# Patient Record
Sex: Male | Born: 1960
Health system: Southern US, Community
[De-identification: ages and names within clinical notes are randomized; demographics above are authoritative.]

---

## 1998-11-21 ENCOUNTER — Ambulatory Visit (HOSPITAL_COMMUNITY): Admission: RE | Admit: 1998-11-21 | Discharge: 1998-11-21 | Payer: Self-pay | Admitting: Orthopedic Surgery

## 1998-11-21 ENCOUNTER — Encounter: Payer: Self-pay | Admitting: Orthopedic Surgery

## 2001-05-12 ENCOUNTER — Emergency Department (HOSPITAL_COMMUNITY): Admission: EM | Admit: 2001-05-12 | Discharge: 2001-05-12 | Payer: Self-pay | Admitting: Emergency Medicine

## 2008-05-24 ENCOUNTER — Emergency Department (HOSPITAL_COMMUNITY): Admission: EM | Admit: 2008-05-24 | Discharge: 2008-05-24 | Payer: Self-pay | Admitting: Emergency Medicine

## 2009-03-12 IMAGING — CT CT ABDOMEN W/O CM
1 of 2 series · 15 of 32 positions shown, 19 images · non-contrast
Comparison: No priors

CT ABDOMEN

CLINICAL DATA: Left flank pain

CT ABDOMEN AND PELVIS WITHOUT CONTRAST
TECHNIQUE: Multidetector CT imaging of the abdomen and pelvis was
performed following the standard
protocol without intravenous contrast.

[Series 2: renal stone 5.0 b31f st · axial · 0.70mm/px · z∈[-403,-13]mm · 15 of 86 slices shown, 19 images]
[im 4/86  soft-tissue]
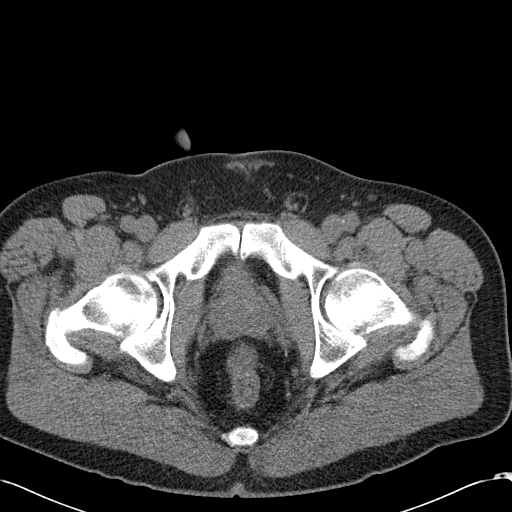
[im 4/86  bone]
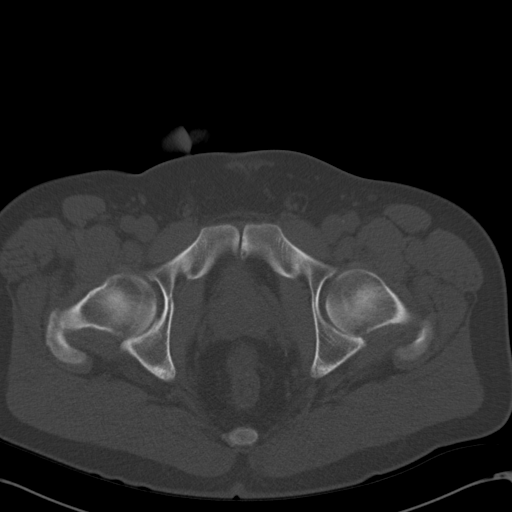
[im 10/86  soft-tissue]
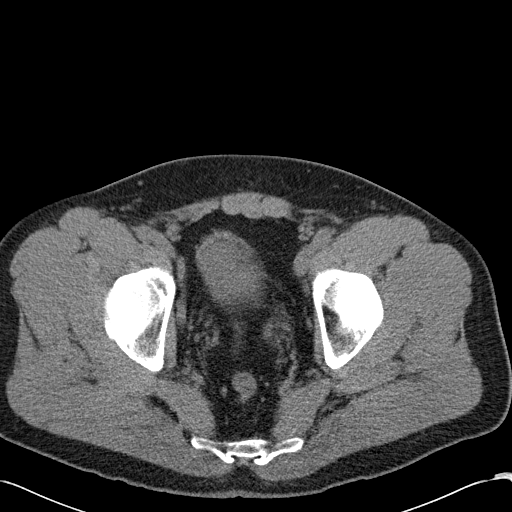
[im 17/86  soft-tissue]
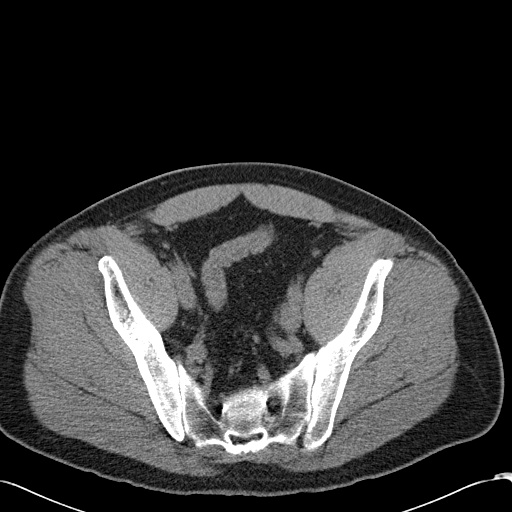
[im 23/86  soft-tissue]
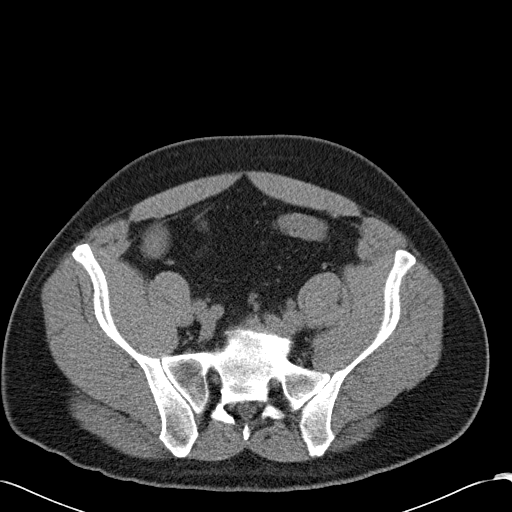
[im 30/86  soft-tissue]
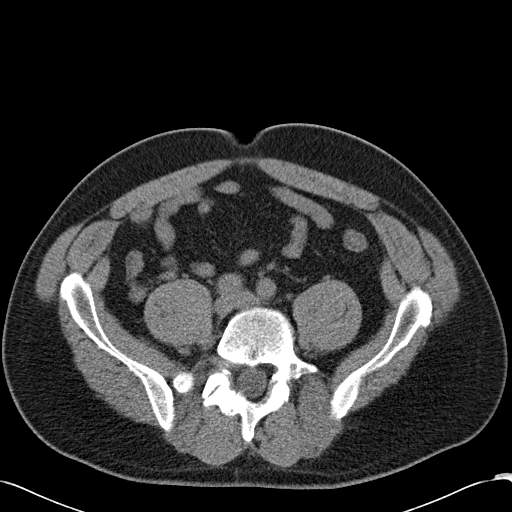
[im 36/86  soft-tissue]
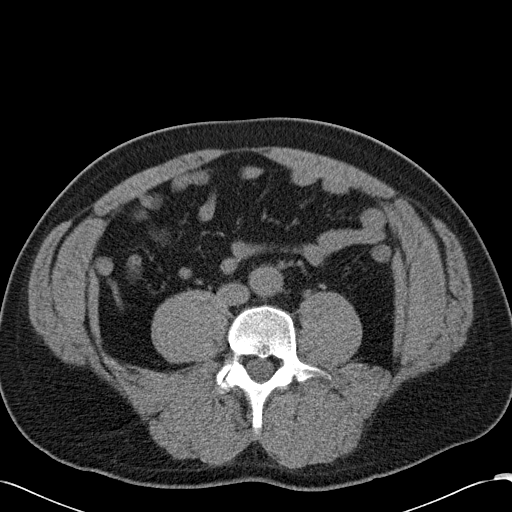
[im 43/86  soft-tissue]
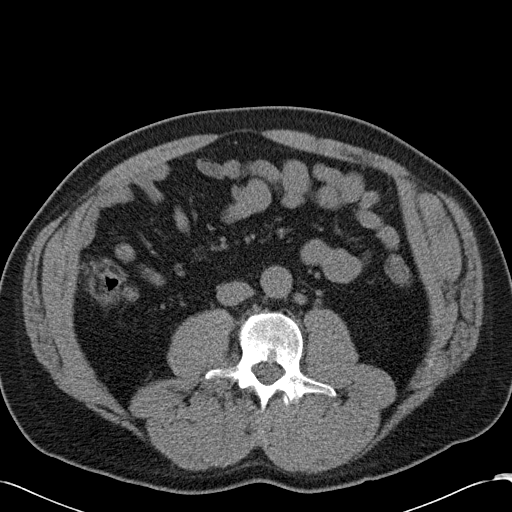
[im 50/86  soft-tissue]
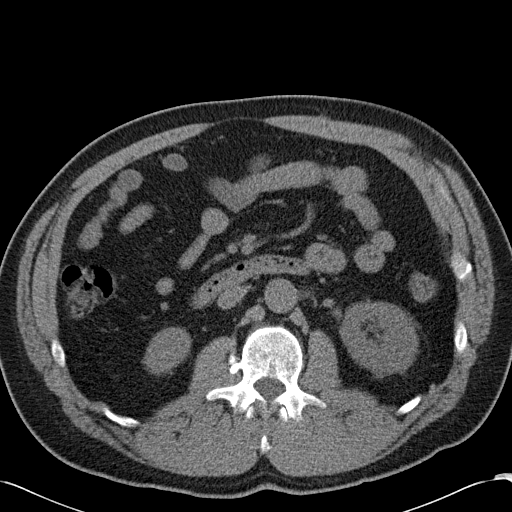
[im 56/86  soft-tissue]
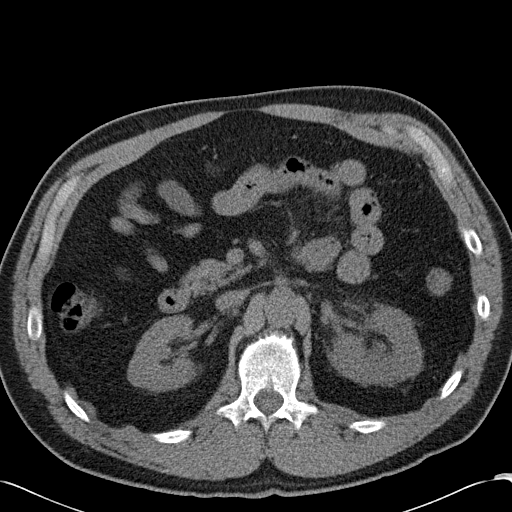
[im 56/86  bone]
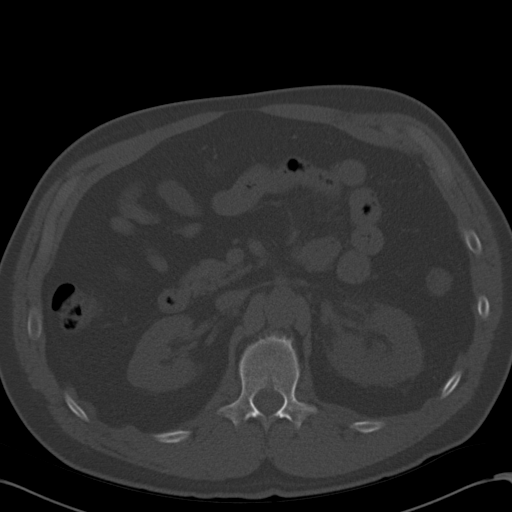
[im 63/86  soft-tissue]
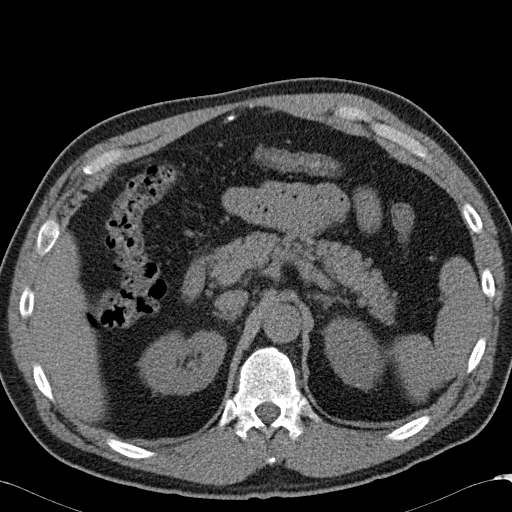
[im 69/86  soft-tissue]
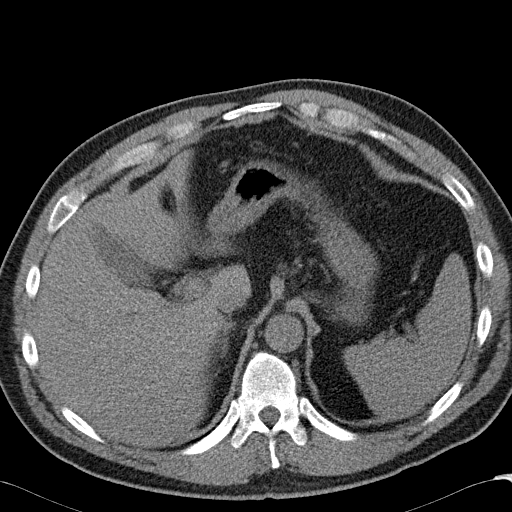
[im 72/86  lung]
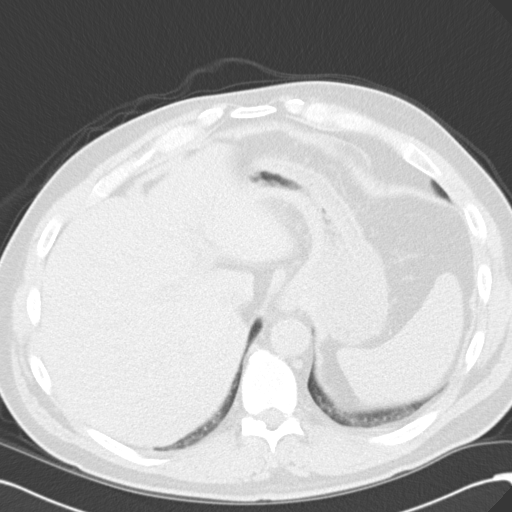
[im 76/86  soft-tissue]
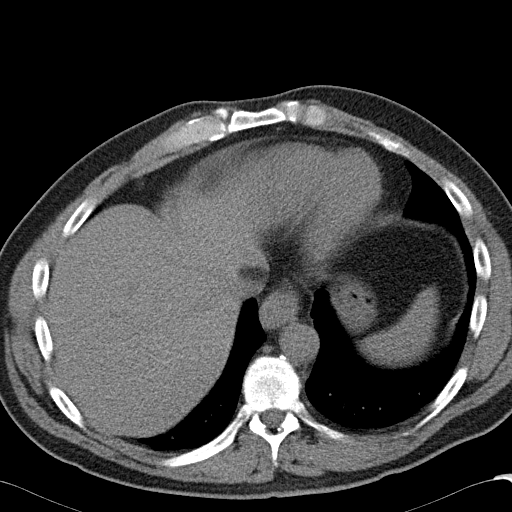
[im 76/86  lung]
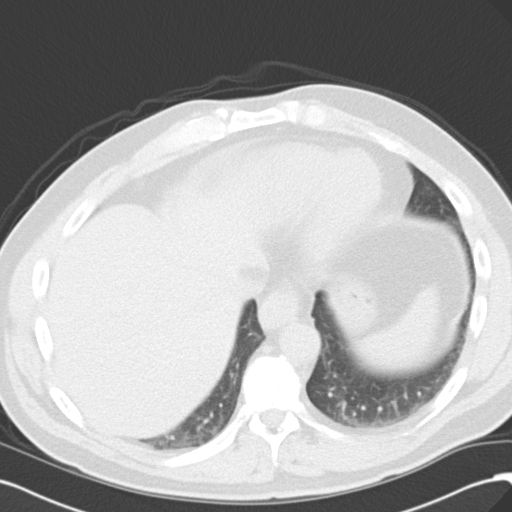
[im 79/86  lung]
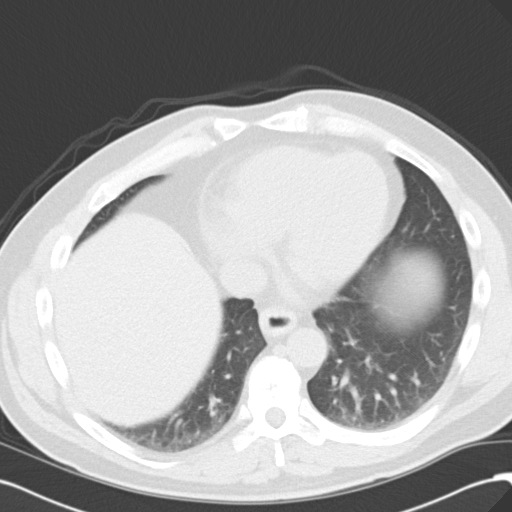
[im 82/86  soft-tissue]
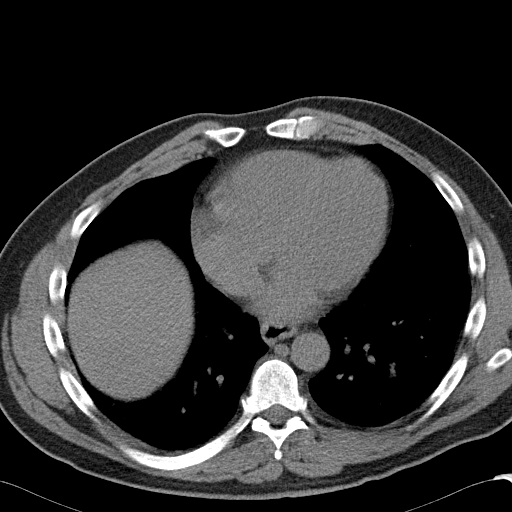
[im 82/86  lung]
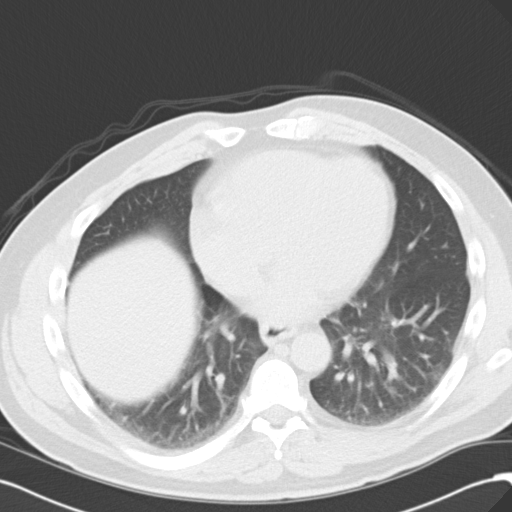

[15 of 32 positions shown; findings below may reference images not displayed]

FINDINGS: The lung bases are clear.  Right kidney normal.  Left
kidney shows perinephric stranding and mild hydronephrosis and
hydroureter consistent with obstruction.  There are no renal
calculi on the left at this time. The left renal vein is retro -
aortic in location.

Other visceral unremarkable in the unenhanced state.  No adenopathy
or ascites.
IMPRESSION: Findings consistent with obstructive uropathy on the left

CT PELVIS
FINDINGS: A mildly dilated left ureter can be followed down to the
UVJ, where there is a 2.8 mm calculus.  This is seen in all three
planes.  There are no other findings of significance.  The appendix
is normal.  Visualized bowel normal.  No masses or fluid
collections.  There are prostatic calcifications.
IMPRESSION: 2.8 mm calculus at the left UVJ causing a mild obstructive
uropathy.  No other acute or significant findings.

## 2011-07-13 LAB — DIFFERENTIAL
Basophils Absolute: 0
Basophils Relative: 1
Eosinophils Absolute: 0.1
Eosinophils Relative: 2
Lymphocytes Relative: 28
Lymphs Abs: 2
Monocytes Absolute: 0.5
Monocytes Relative: 7
Neutro Abs: 4.5
Neutrophils Relative %: 63

## 2011-07-13 LAB — URINALYSIS, ROUTINE W REFLEX MICROSCOPIC
Glucose, UA: NEGATIVE
Ketones, ur: 15 — AB
Protein, ur: NEGATIVE

## 2011-07-13 LAB — POCT I-STAT, CHEM 8
BUN: 18
Calcium, Ion: 1.2
Chloride: 107
Creatinine, Ser: 1.4
Glucose, Bld: 124 — ABNORMAL HIGH
HCT: 44
Hemoglobin: 15
Potassium: 4.1
Sodium: 143
TCO2: 28

## 2011-07-13 LAB — CBC
HCT: 43
Hemoglobin: 14.7
MCHC: 34.3
MCV: 90.6
Platelets: 210
RBC: 4.74
RDW: 12.8
WBC: 7.2

## 2011-07-13 LAB — URINE MICROSCOPIC-ADD ON

## 2016-05-16 DIAGNOSIS — D649 Anemia, unspecified: Secondary | ICD-10-CM | POA: Diagnosis not present

## 2016-05-16 DIAGNOSIS — Z0000 Encounter for general adult medical examination without abnormal findings: Secondary | ICD-10-CM | POA: Diagnosis not present

## 2016-05-16 DIAGNOSIS — R8290 Unspecified abnormal findings in urine: Secondary | ICD-10-CM | POA: Diagnosis not present

## 2017-02-22 ENCOUNTER — Encounter: Payer: Self-pay | Admitting: Emergency Medicine

## 2017-02-22 ENCOUNTER — Emergency Department: Payer: BLUE CROSS/BLUE SHIELD

## 2017-02-22 ENCOUNTER — Emergency Department
Admission: EM | Admit: 2017-02-22 | Discharge: 2017-02-22 | Disposition: A | Discharge status: Home / Self Care | Payer: BLUE CROSS/BLUE SHIELD | Attending: Emergency Medicine | Admitting: Emergency Medicine

## 2017-02-22 DIAGNOSIS — Y939 Activity, unspecified: Secondary | ICD-10-CM | POA: Diagnosis not present

## 2017-02-22 DIAGNOSIS — Y999 Unspecified external cause status: Secondary | ICD-10-CM | POA: Diagnosis not present

## 2017-02-22 DIAGNOSIS — S93401A Sprain of unspecified ligament of right ankle, initial encounter: Secondary | ICD-10-CM | POA: Insufficient documentation

## 2017-02-22 DIAGNOSIS — Y929 Unspecified place or not applicable: Secondary | ICD-10-CM | POA: Diagnosis not present

## 2017-02-22 DIAGNOSIS — S99911A Unspecified injury of right ankle, initial encounter: Secondary | ICD-10-CM | POA: Diagnosis not present

## 2017-02-22 DIAGNOSIS — W11XXXA Fall on and from ladder, initial encounter: Secondary | ICD-10-CM | POA: Diagnosis not present

## 2017-02-22 DIAGNOSIS — M25571 Pain in right ankle and joints of right foot: Secondary | ICD-10-CM | POA: Diagnosis not present

## 2017-02-22 DIAGNOSIS — M7989 Other specified soft tissue disorders: Secondary | ICD-10-CM | POA: Diagnosis not present

## 2017-02-22 NOTE — ED Notes (Signed)
See triage note, pt reports falling 5 feet off a ladder. Pt denies LOC or head inj however reports right ankle pain and swelling since fall. Pt states he is able to apply pressure to right ankle however reports increased pain. Pulses intact. Swelling noted to right ankle. Pt reports applying ice to area as well as taking ibuprofen prior to arrival. Pt A&O and in NAD at this time.

## 2017-02-22 NOTE — Discharge Instructions (Signed)
Your exam and x-ray are negative for an acute ankle fracture (break). You do have a moderate sprain to the ankle. Take ibuprofen as needed for pain and swelling. Rest, ice, and elevate when seated. Follow-up with podiatry for ongoing symptoms.

## 2017-02-22 NOTE — ED Triage Notes (Signed)
Pt fell ~ 5 ft from ladder landing seated on R ankle. Pt limping to stat desk. In no observable acute distress.

## 2017-02-22 NOTE — ED Provider Notes (Signed)
[facility] Emergency Department Provider Note ____________________________________________  Time seen: 2230  I have reviewed the triage vital signs and the nursing notes.  HISTORY  Chief Complaint  Ankle Pain  HPI Paul Mccoy is a 56 y.o. male Presents to the ED for evaluation of right ankle pain.He describes falling about 5 feet from a ladder, landing squarely on his right foot and ankle. The patient has had pain and difficult with ambulation but is able to bear weight with a limping gait. He denies any other injury at this time.  History reviewed. No pertinent past medical history.  There are no active problems to display for this patient.   History reviewed. No pertinent surgical history.  Prior to Admission medications   Not on File    Allergies Patient has no known allergies.  No family history on file.  Social History Social History  Substance Use Topics  . Smoking status: Never Smoker  . Smokeless tobacco: Never Used  . Alcohol use 3.6 oz/week    6 Cans of beer per week    Review of Systems  Constitutional: Negative for fever. Cardiovascular: Negative for chest pain. Musculoskeletal: Negative for back pain. Right ankle pain as above. Skin: Negative for rash. Neurological: Negative for headaches, focal weakness or numbness. ____________________________________________  PHYSICAL EXAM:  VITAL SIGNS: ED Triage Vitals  Enc Vitals Group     BP 02/22/17 2107 (!) 128/95     Pulse Rate 02/22/17 2107 93     Resp 02/22/17 2107 18     Temp 02/22/17 2107 98.7 F (37.1 C)     Temp Source 02/22/17 2107 Oral     SpO2 02/22/17 2107 97 %     Weight 02/22/17 2109 195 lb (88.5 kg)     Height 02/22/17 2109 5\' 8"  (1.727 m)     Head Circumference --      Peak Flow --      Pain Score 02/22/17 2107 5     Pain Loc --      Pain Edu? --      Excl. in GC? --     Constitutional: Alert and oriented. Well appearing and in no distress. Head:  Normocephalic and atraumatic. Cardiovascular: Normal rate, regular rhythm. Normal distal pulses. Respiratory: Normal respiratory effort.  Musculoskeletal: Right ankle with obvious lateral soft tissue swelling. Patient with normal ankle range of motion. grossly symmetric as noted. He is without tenderness to the bony prominences of the malleoli. He does have some tenderness to the lateral ligaments of the ankle joint. Negative anterior drawer. Nontender with normal range of motion in all extremities.  Neurologic: Normal speech and language. No gross focal neurologic deficits are appreciated. Skin:  Skin is warm, dry and intact. No rash noted. ___________________________________________   RADIOLOGY  Right Ankle IMPRESSION: Negative for fracture.  Soft tissue swelling.  I, Corinthian Mizrahi, Charlesetta IvoryJenise V Bacon, personally viewed and evaluated these images (plain radiographs) as part of my medical decision making, as well as reviewing the written report by the radiologist. ____________________________________________  PROCEDURES  Ace bandage ____________________________________________  INITIAL IMPRESSION / ASSESSMENT AND PLAN / ED COURSE  Patient with a right grade 2 ankle sprain without evidence of radiologic fracture or dislocation. He is fitted with an Ace bandage and given instructions on RICE management. Low-dose ibuprofen and follow-up with podiatry for ongoing symptom management. ____________________________________________  FINAL CLINICAL IMPRESSION(S) / ED DIAGNOSES  Final diagnoses:  Sprain of right ankle, unspecified ligament, initial encounter  Lissa Hoard, PA-C 02/22/17 2252    Emily Filbert, MD 02/22/17 [phone]

## 2017-06-14 DIAGNOSIS — E785 Hyperlipidemia, unspecified: Secondary | ICD-10-CM | POA: Diagnosis not present

## 2017-06-14 DIAGNOSIS — H6123 Impacted cerumen, bilateral: Secondary | ICD-10-CM | POA: Diagnosis not present

## 2017-06-14 DIAGNOSIS — Z0000 Encounter for general adult medical examination without abnormal findings: Secondary | ICD-10-CM | POA: Diagnosis not present

## 2017-06-14 DIAGNOSIS — R8290 Unspecified abnormal findings in urine: Secondary | ICD-10-CM | POA: Diagnosis not present

## 2017-06-14 DIAGNOSIS — Z23 Encounter for immunization: Secondary | ICD-10-CM | POA: Diagnosis not present

## 2017-12-11 IMAGING — CR DG ANKLE COMPLETE 3+V*R*
1 series · 3 of 3 positions shown · non-contrast
Comparison: None.

CLINICAL DATA: Patient fell landing on RIGHT ankle.  Pain.

EXAM:
RIGHT ANKLE - COMPLETE 3+ VIEW

[Series 1: dg ankle complete right · 0.14mm/px · 3 of 3 slices shown]
[im 1/3]
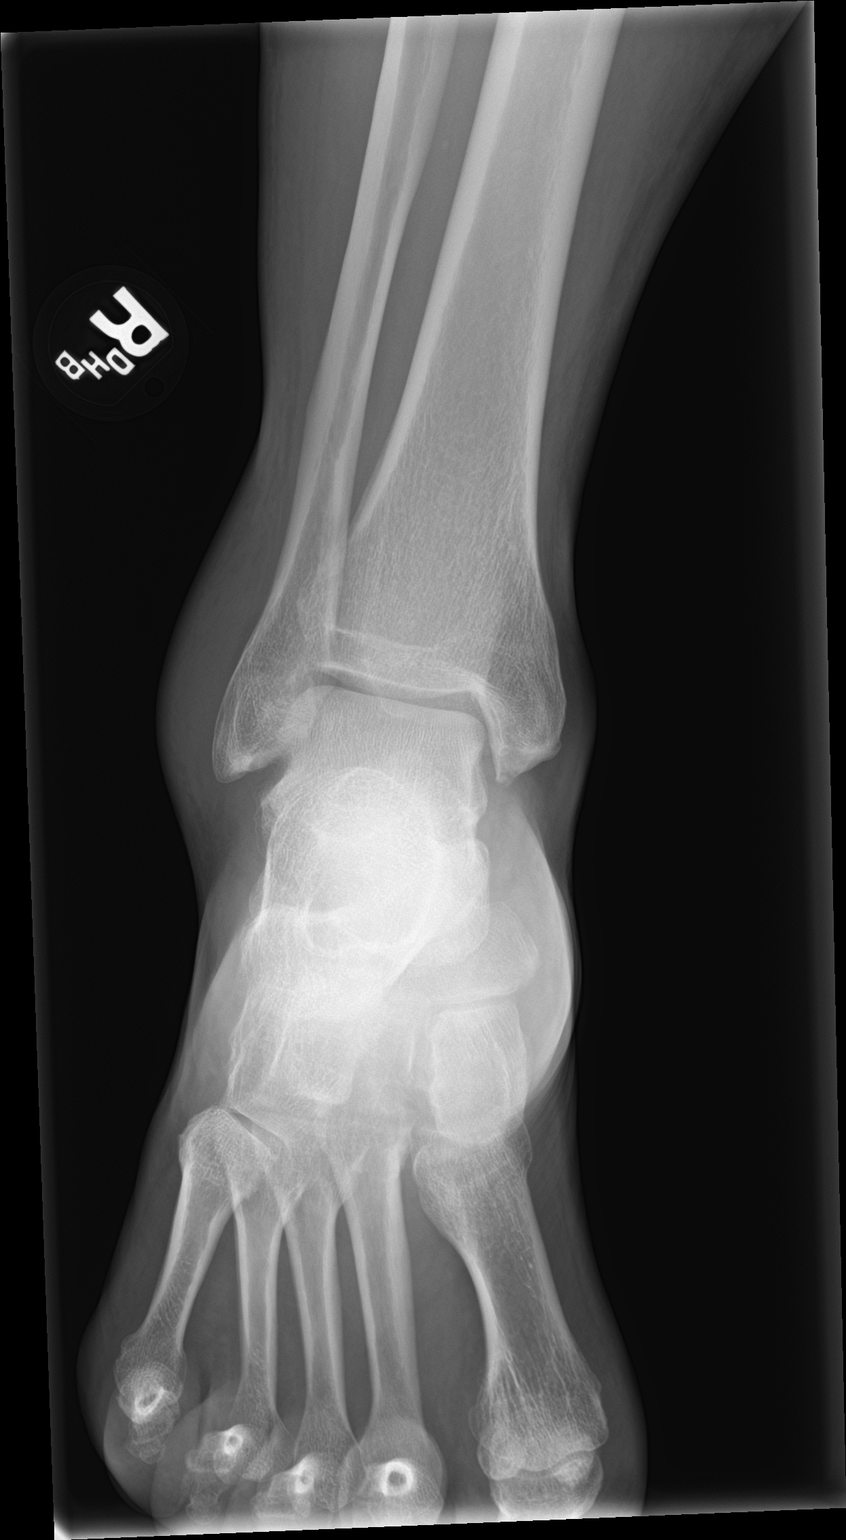
[im 2/3]
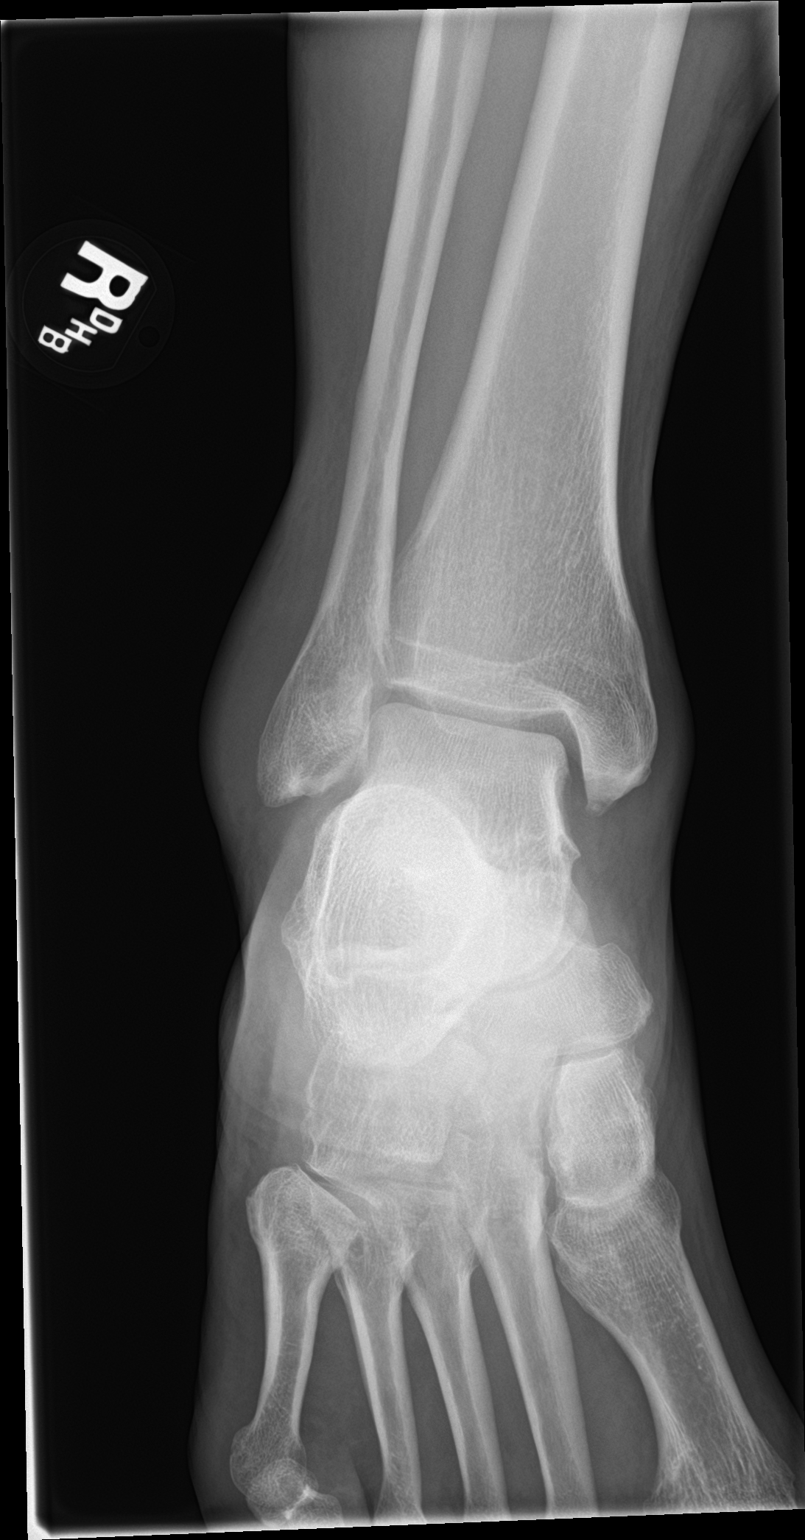
[im 3/3]
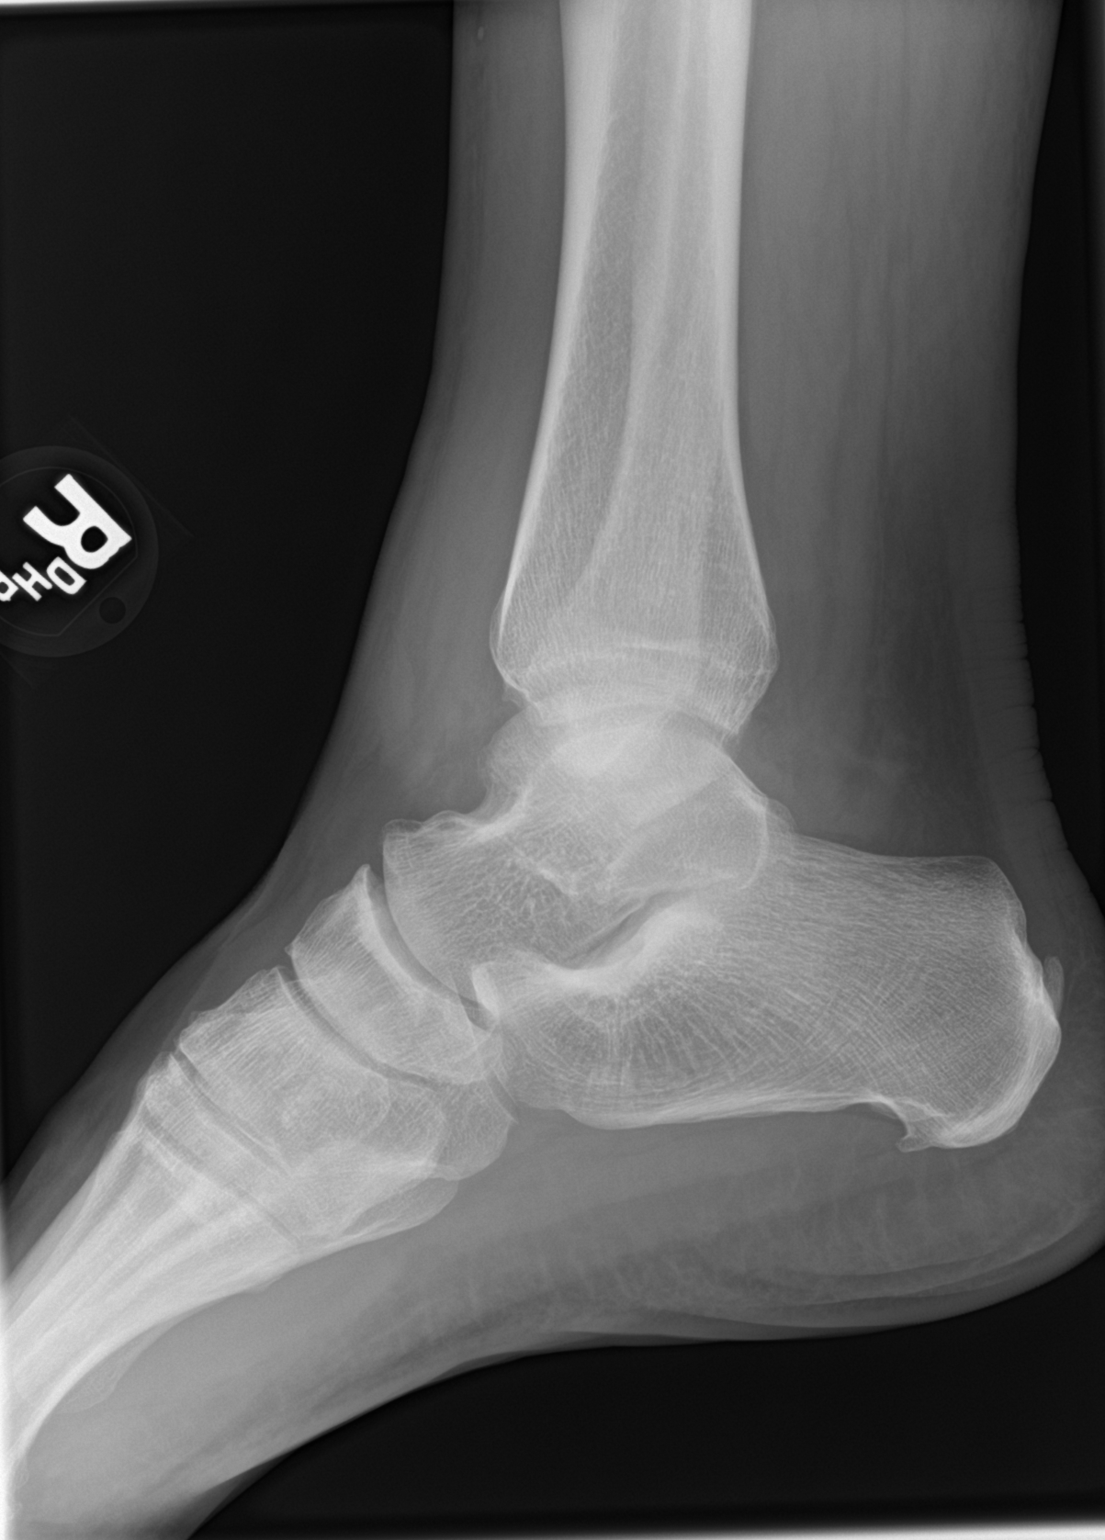

[3 of 3 positions shown; findings below may reference images not displayed]

FINDINGS: There is no evidence of fracture, or dislocation. Ankle mortise
appears intact. There is marked soft tissue swelling anteriorly and
laterally.
IMPRESSION: Negative for fracture.  Soft tissue swelling.

## 2018-06-20 DIAGNOSIS — D229 Melanocytic nevi, unspecified: Secondary | ICD-10-CM | POA: Diagnosis not present

## 2018-06-20 DIAGNOSIS — Z0000 Encounter for general adult medical examination without abnormal findings: Secondary | ICD-10-CM | POA: Diagnosis not present

## 2018-06-20 DIAGNOSIS — E785 Hyperlipidemia, unspecified: Secondary | ICD-10-CM | POA: Diagnosis not present

## 2019-06-25 DIAGNOSIS — E785 Hyperlipidemia, unspecified: Secondary | ICD-10-CM | POA: Diagnosis not present

## 2019-06-25 DIAGNOSIS — Z0000 Encounter for general adult medical examination without abnormal findings: Secondary | ICD-10-CM | POA: Diagnosis not present

## 2019-06-25 DIAGNOSIS — Z125 Encounter for screening for malignant neoplasm of prostate: Secondary | ICD-10-CM | POA: Diagnosis not present

## 2019-06-25 DIAGNOSIS — Z23 Encounter for immunization: Secondary | ICD-10-CM | POA: Diagnosis not present

## 2019-08-24 DIAGNOSIS — R52 Pain, unspecified: Secondary | ICD-10-CM | POA: Diagnosis not present

## 2019-08-24 DIAGNOSIS — R6883 Chills (without fever): Secondary | ICD-10-CM | POA: Diagnosis not present

## 2019-08-28 DIAGNOSIS — Z20828 Contact with and (suspected) exposure to other viral communicable diseases: Secondary | ICD-10-CM | POA: Diagnosis not present

## 2019-09-15 ENCOUNTER — Other Ambulatory Visit: Payer: Self-pay

## 2019-09-15 ENCOUNTER — Ambulatory Visit
Admission: EM | Admit: 2019-09-15 | Discharge: 2019-09-15 | Disposition: A | Discharge status: Home / Self Care | Payer: BC Managed Care – PPO | Attending: Emergency Medicine | Admitting: Emergency Medicine

## 2019-09-15 DIAGNOSIS — Z20828 Contact with and (suspected) exposure to other viral communicable diseases: Secondary | ICD-10-CM | POA: Diagnosis not present

## 2019-09-15 DIAGNOSIS — Z20822 Contact with and (suspected) exposure to covid-19: Secondary | ICD-10-CM

## 2019-09-15 NOTE — Discharge Instructions (Signed)
COVID testing ordered.  It will take between 5-7 days for test results.  Someone will contact you regarding abnormal results.   ° °In the meantime: °You should remain isolated in your home for 10 days from symptom onset AND greater than 72 hours after symptoms resolution (absence of fever without the use of fever-reducing medication and improvement in respiratory symptoms), whichever is longer °OR 14 days from exposure °Get plenty of rest and push fluids °Use OTC zyrtec for nasal congestion, runny nose, and/or sore throat °Use OTC flonase for nasal congestion and runny nose °Use medications daily for symptom relief °Use OTC medications like ibuprofen or tylenol as needed fever or pain °Call or go to the ED if you have any new or worsening symptoms such as fever, worsening cough, shortness of breath, chest tightness, chest pain, turning blue, changes in mental status, etc...  °

## 2019-09-15 NOTE — ED Triage Notes (Signed)
Pt here for covid testing after positive  Exposure on Thursday

## 2019-09-15 NOTE — ED Provider Notes (Signed)
[facility]   [id number] 09/15/19 Arrival Time: 1658   CC: COVID exposure; covid test  SUBJECTIVE: History from: patient.  Paul Mccoy is a 58 y.o. male who presents for COVID testing.  Son tested positive.  Last exposure was 5 days ago.  Denies aggravating or alleviating symptoms.  Denies previous COVID infection.   Denies fever, chills, fatigue, nasal congestion, rhinorrhea, sore throat, cough, SOB, wheezing, chest pain, nausea, vomiting, changes in bowel or bladder habits.     ROS: As per HPI.  All other pertinent ROS negative.     History reviewed. No pertinent past medical history. History reviewed. No pertinent surgical history. No Known Allergies No current facility-administered medications on file prior to encounter.    No current outpatient medications on file prior to encounter.   Social History   Socioeconomic History  . Marital status: Married    Spouse name: Not on file  . Number of children: Not on file  . Years of education: Not on file  . Highest education level: Not on file  Occupational History  . Not on file  Social Needs  . Financial resource strain: Not on file  . Food insecurity    Worry: Not on file    Inability: Not on file  . Transportation needs    Medical: Not on file    Non-medical: Not on file  Tobacco Use  . Smoking status: Never Smoker  . Smokeless tobacco: Never Used  Substance and Sexual Activity  . Alcohol use: Yes    Alcohol/week: 6.0 standard drinks    Types: 6 Cans of beer per week  . Drug use: No  . Sexual activity: Not on file  Lifestyle  . Physical activity    Days per week: Not on file    Minutes per session: Not on file  . Stress: Not on file  Relationships  . Social Musician on phone: Not on file    Gets together: Not on file    Attends religious service: Not on file    Active member of club or organization: Not on file    Attends meetings of clubs or organizations: Not on file   Relationship status: Not on file  . Intimate partner violence    Fear of current or ex partner: Not on file    Emotionally abused: Not on file    Physically abused: Not on file    Forced sexual activity: Not on file  Other Topics Concern  . Not on file  Social History Narrative  . Not on file   Family History  Problem Relation Age of Onset  . Diabetes Mother   . Diabetes Father     OBJECTIVE:  Vitals:   09/15/19 1718  BP: (!) 149/94  Pulse: 67  Resp: 18  Temp: 98.7 F (37.1 C)  SpO2: 96%     General appearance: alert; well-appearing, nontoxic; speaking in full sentences and tolerating own secretions HEENT: NCAT; Ears: EACs clear, TMs pearly gray; Eyes: PERRL.  EOM grossly intact. Nose: nares patent without rhinorrhea, Throat: oropharynx clear, tonsils non erythematous or enlarged, uvula midline  Neck: supple without LAD Lungs: unlabored respirations, symmetrical air entry; cough: absent; no respiratory distress; CTAB Heart: regular rate and rhythm.  Radial pulses 2+ symmetrical bilaterally Skin: warm and dry Psychological: alert and cooperative; normal mood and affect  ASSESSMENT & PLAN:  1. Close exposure to COVID-19 virus   2. Encounter for laboratory testing for COVID-19 virus  COVID testing ordered.  It will take between 5-7 days for test results.  Someone will contact you regarding abnormal results.    In the meantime: You should remain isolated in your home for 10 days from symptom onset AND greater than 72 hours after symptoms resolution (absence of fever without the use of fever-reducing medication and improvement in respiratory symptoms), whichever is longer OR 14 days from exposure Get plenty of rest and push fluids Use OTC zyrtec for nasal congestion, runny nose, and/or sore throat Use OTC flonase for nasal congestion and runny nose Use medications daily for symptom relief Use OTC medications like ibuprofen or tylenol as needed fever or pain Call or  go to the ED if you have any new or worsening symptoms such as fever, worsening cough, shortness of breath, chest tightness, chest pain, turning blue, changes in mental status, etc...   Reviewed expectations re: course of current medical issues. Questions answered. Outlined signs and symptoms indicating need for more acute intervention. Patient verbalized understanding. After Visit Summary given.         Lestine Box, PA-C 09/15/19 1755

## 2019-09-17 LAB — NOVEL CORONAVIRUS, NAA: SARS-CoV-2, NAA: NOT DETECTED

## 2021-08-09 DIAGNOSIS — Z13220 Encounter for screening for lipoid disorders: Secondary | ICD-10-CM | POA: Diagnosis not present

## 2021-08-09 DIAGNOSIS — E785 Hyperlipidemia, unspecified: Secondary | ICD-10-CM | POA: Diagnosis not present

## 2021-08-09 DIAGNOSIS — Z0000 Encounter for general adult medical examination without abnormal findings: Secondary | ICD-10-CM | POA: Diagnosis not present

## 2021-08-09 DIAGNOSIS — Z125 Encounter for screening for malignant neoplasm of prostate: Secondary | ICD-10-CM | POA: Diagnosis not present

## 2021-08-21 DIAGNOSIS — M4126 Other idiopathic scoliosis, lumbar region: Secondary | ICD-10-CM | POA: Diagnosis not present

## 2021-08-21 DIAGNOSIS — M9905 Segmental and somatic dysfunction of pelvic region: Secondary | ICD-10-CM | POA: Diagnosis not present

## 2021-08-21 DIAGNOSIS — M9903 Segmental and somatic dysfunction of lumbar region: Secondary | ICD-10-CM | POA: Diagnosis not present

## 2021-08-21 DIAGNOSIS — M5386 Other specified dorsopathies, lumbar region: Secondary | ICD-10-CM | POA: Diagnosis not present

## 2021-08-23 DIAGNOSIS — M4126 Other idiopathic scoliosis, lumbar region: Secondary | ICD-10-CM | POA: Diagnosis not present

## 2021-08-23 DIAGNOSIS — M9903 Segmental and somatic dysfunction of lumbar region: Secondary | ICD-10-CM | POA: Diagnosis not present

## 2021-08-23 DIAGNOSIS — M5386 Other specified dorsopathies, lumbar region: Secondary | ICD-10-CM | POA: Diagnosis not present

## 2021-08-23 DIAGNOSIS — M9905 Segmental and somatic dysfunction of pelvic region: Secondary | ICD-10-CM | POA: Diagnosis not present

## 2021-08-24 DIAGNOSIS — M4126 Other idiopathic scoliosis, lumbar region: Secondary | ICD-10-CM | POA: Diagnosis not present

## 2021-08-24 DIAGNOSIS — M5386 Other specified dorsopathies, lumbar region: Secondary | ICD-10-CM | POA: Diagnosis not present

## 2021-08-24 DIAGNOSIS — M9903 Segmental and somatic dysfunction of lumbar region: Secondary | ICD-10-CM | POA: Diagnosis not present

## 2021-08-24 DIAGNOSIS — M9905 Segmental and somatic dysfunction of pelvic region: Secondary | ICD-10-CM | POA: Diagnosis not present

## 2021-08-28 DIAGNOSIS — M9903 Segmental and somatic dysfunction of lumbar region: Secondary | ICD-10-CM | POA: Diagnosis not present

## 2021-08-28 DIAGNOSIS — M5386 Other specified dorsopathies, lumbar region: Secondary | ICD-10-CM | POA: Diagnosis not present

## 2021-08-28 DIAGNOSIS — M9905 Segmental and somatic dysfunction of pelvic region: Secondary | ICD-10-CM | POA: Diagnosis not present

## 2021-08-28 DIAGNOSIS — M4126 Other idiopathic scoliosis, lumbar region: Secondary | ICD-10-CM | POA: Diagnosis not present

## 2021-08-31 DIAGNOSIS — M4126 Other idiopathic scoliosis, lumbar region: Secondary | ICD-10-CM | POA: Diagnosis not present

## 2021-08-31 DIAGNOSIS — M9903 Segmental and somatic dysfunction of lumbar region: Secondary | ICD-10-CM | POA: Diagnosis not present

## 2021-08-31 DIAGNOSIS — M5386 Other specified dorsopathies, lumbar region: Secondary | ICD-10-CM | POA: Diagnosis not present

## 2021-08-31 DIAGNOSIS — M9905 Segmental and somatic dysfunction of pelvic region: Secondary | ICD-10-CM | POA: Diagnosis not present

## 2021-09-11 DIAGNOSIS — M5386 Other specified dorsopathies, lumbar region: Secondary | ICD-10-CM | POA: Diagnosis not present

## 2021-09-11 DIAGNOSIS — M9903 Segmental and somatic dysfunction of lumbar region: Secondary | ICD-10-CM | POA: Diagnosis not present

## 2021-09-11 DIAGNOSIS — M9905 Segmental and somatic dysfunction of pelvic region: Secondary | ICD-10-CM | POA: Diagnosis not present

## 2021-09-11 DIAGNOSIS — M4126 Other idiopathic scoliosis, lumbar region: Secondary | ICD-10-CM | POA: Diagnosis not present

## 2021-09-14 DIAGNOSIS — M5386 Other specified dorsopathies, lumbar region: Secondary | ICD-10-CM | POA: Diagnosis not present

## 2021-09-14 DIAGNOSIS — M9903 Segmental and somatic dysfunction of lumbar region: Secondary | ICD-10-CM | POA: Diagnosis not present

## 2021-09-14 DIAGNOSIS — M4126 Other idiopathic scoliosis, lumbar region: Secondary | ICD-10-CM | POA: Diagnosis not present

## 2021-09-14 DIAGNOSIS — M9905 Segmental and somatic dysfunction of pelvic region: Secondary | ICD-10-CM | POA: Diagnosis not present

## 2021-09-18 DIAGNOSIS — M5386 Other specified dorsopathies, lumbar region: Secondary | ICD-10-CM | POA: Diagnosis not present

## 2021-09-18 DIAGNOSIS — M4126 Other idiopathic scoliosis, lumbar region: Secondary | ICD-10-CM | POA: Diagnosis not present

## 2021-09-18 DIAGNOSIS — M9905 Segmental and somatic dysfunction of pelvic region: Secondary | ICD-10-CM | POA: Diagnosis not present

## 2021-09-18 DIAGNOSIS — M9903 Segmental and somatic dysfunction of lumbar region: Secondary | ICD-10-CM | POA: Diagnosis not present

## 2021-09-26 DIAGNOSIS — M9905 Segmental and somatic dysfunction of pelvic region: Secondary | ICD-10-CM | POA: Diagnosis not present

## 2021-09-26 DIAGNOSIS — M9903 Segmental and somatic dysfunction of lumbar region: Secondary | ICD-10-CM | POA: Diagnosis not present

## 2021-09-26 DIAGNOSIS — M4126 Other idiopathic scoliosis, lumbar region: Secondary | ICD-10-CM | POA: Diagnosis not present

## 2021-09-26 DIAGNOSIS — M5386 Other specified dorsopathies, lumbar region: Secondary | ICD-10-CM | POA: Diagnosis not present

## 2021-09-28 DIAGNOSIS — M9905 Segmental and somatic dysfunction of pelvic region: Secondary | ICD-10-CM | POA: Diagnosis not present

## 2021-09-28 DIAGNOSIS — M5386 Other specified dorsopathies, lumbar region: Secondary | ICD-10-CM | POA: Diagnosis not present

## 2021-09-28 DIAGNOSIS — M4126 Other idiopathic scoliosis, lumbar region: Secondary | ICD-10-CM | POA: Diagnosis not present

## 2021-09-28 DIAGNOSIS — M9903 Segmental and somatic dysfunction of lumbar region: Secondary | ICD-10-CM | POA: Diagnosis not present

## 2021-10-03 DIAGNOSIS — M4126 Other idiopathic scoliosis, lumbar region: Secondary | ICD-10-CM | POA: Diagnosis not present

## 2021-10-03 DIAGNOSIS — M9903 Segmental and somatic dysfunction of lumbar region: Secondary | ICD-10-CM | POA: Diagnosis not present

## 2021-10-03 DIAGNOSIS — M9905 Segmental and somatic dysfunction of pelvic region: Secondary | ICD-10-CM | POA: Diagnosis not present

## 2021-10-03 DIAGNOSIS — M5386 Other specified dorsopathies, lumbar region: Secondary | ICD-10-CM | POA: Diagnosis not present

## 2021-10-13 DIAGNOSIS — U071 COVID-19: Secondary | ICD-10-CM | POA: Diagnosis not present

## 2022-08-14 DIAGNOSIS — E785 Hyperlipidemia, unspecified: Secondary | ICD-10-CM | POA: Diagnosis not present

## 2022-08-14 DIAGNOSIS — Z23 Encounter for immunization: Secondary | ICD-10-CM | POA: Diagnosis not present

## 2022-08-14 DIAGNOSIS — R030 Elevated blood-pressure reading, without diagnosis of hypertension: Secondary | ICD-10-CM | POA: Diagnosis not present

## 2022-08-14 DIAGNOSIS — Z125 Encounter for screening for malignant neoplasm of prostate: Secondary | ICD-10-CM | POA: Diagnosis not present

## 2022-08-14 DIAGNOSIS — Z0000 Encounter for general adult medical examination without abnormal findings: Secondary | ICD-10-CM | POA: Diagnosis not present

## 2023-02-25 DIAGNOSIS — Z131 Encounter for screening for diabetes mellitus: Secondary | ICD-10-CM | POA: Diagnosis not present

## 2023-02-25 DIAGNOSIS — E785 Hyperlipidemia, unspecified: Secondary | ICD-10-CM | POA: Diagnosis not present

## 2023-02-25 DIAGNOSIS — E668 Other obesity: Secondary | ICD-10-CM | POA: Diagnosis not present

## 2023-02-25 DIAGNOSIS — R030 Elevated blood-pressure reading, without diagnosis of hypertension: Secondary | ICD-10-CM | POA: Diagnosis not present

## 2023-02-25 DIAGNOSIS — Z0000 Encounter for general adult medical examination without abnormal findings: Secondary | ICD-10-CM | POA: Diagnosis not present

## 2023-07-08 DIAGNOSIS — U071 COVID-19: Secondary | ICD-10-CM | POA: Diagnosis not present

## 2023-08-28 DIAGNOSIS — E782 Mixed hyperlipidemia: Secondary | ICD-10-CM | POA: Diagnosis not present

## 2023-08-28 DIAGNOSIS — Z6829 Body mass index (BMI) 29.0-29.9, adult: Secondary | ICD-10-CM | POA: Diagnosis not present

## 2023-08-28 DIAGNOSIS — R7303 Prediabetes: Secondary | ICD-10-CM | POA: Diagnosis not present

## 2024-05-01 ENCOUNTER — Encounter: Payer: Self-pay | Admitting: Advanced Practice Midwife

## 2024-05-10 ENCOUNTER — Other Ambulatory Visit: Payer: Self-pay | Admitting: Medical Genetics

## 2024-06-02 DIAGNOSIS — M9906 Segmental and somatic dysfunction of lower extremity: Secondary | ICD-10-CM | POA: Diagnosis not present

## 2024-06-02 DIAGNOSIS — M7631 Iliotibial band syndrome, right leg: Secondary | ICD-10-CM | POA: Diagnosis not present

## 2024-08-06 ENCOUNTER — Other Ambulatory Visit (HOSPITAL_COMMUNITY): Payer: Self-pay

## 2024-08-11 ENCOUNTER — Other Ambulatory Visit: Payer: Self-pay | Admitting: Medical Genetics

## 2024-08-11 DIAGNOSIS — Z006 Encounter for examination for normal comparison and control in clinical research program: Secondary | ICD-10-CM

## 2024-09-07 LAB — GENECONNECT MOLECULAR SCREEN: Genetic Analysis Overall Interpretation: NEGATIVE
